# Patient Record
Sex: Female | Born: 1960 | Race: White | Hispanic: No | Marital: Married
Health system: Southern US, Community
[De-identification: ages and names within clinical notes are randomized; demographics above are authoritative.]

---

## 2005-01-31 ENCOUNTER — Ambulatory Visit: Payer: Self-pay | Admitting: Internal Medicine

## 2008-01-09 ENCOUNTER — Ambulatory Visit: Payer: Self-pay | Admitting: Obstetrics and Gynecology

## 2009-01-20 ENCOUNTER — Ambulatory Visit: Payer: Self-pay | Admitting: Obstetrics and Gynecology

## 2012-04-02 ENCOUNTER — Ambulatory Visit: Payer: Self-pay | Admitting: Obstetrics and Gynecology

## 2014-09-21 ENCOUNTER — Ambulatory Visit: Payer: Self-pay | Admitting: Obstetrics and Gynecology

## 2014-11-29 ENCOUNTER — Inpatient Hospital Stay: Payer: Self-pay | Admitting: Surgery

## 2014-11-29 LAB — COMPREHENSIVE METABOLIC PANEL
Albumin: 4 g/dL (ref 3.4–5.0)
Alkaline Phosphatase: 82 U/L
Anion Gap: 7 (ref 7–16)
BUN: 12 mg/dL (ref 7–18)
Bilirubin,Total: 0.4 mg/dL (ref 0.2–1.0)
CALCIUM: 10.3 mg/dL — AB (ref 8.5–10.1)
Chloride: 99 mmol/L (ref 98–107)
Co2: 30 mmol/L (ref 21–32)
Creatinine: 0.82 mg/dL (ref 0.60–1.30)
EGFR (Non-African Amer.): >60
GLUCOSE: 119 mg/dL — AB (ref 65–99)
Osmolality: 273 (ref 275–301)
Potassium: 3.7 mmol/L (ref 3.5–5.1)
SGOT(AST): 17 U/L (ref 15–37)
SGPT (ALT): 32 U/L
Sodium: 136 mmol/L (ref 136–145)
Total Protein: 7.7 g/dL (ref 6.4–8.2)

## 2014-11-29 LAB — URINALYSIS, COMPLETE
BACTERIA: NONE SEEN
BILIRUBIN, UR: NEGATIVE
BLOOD: NEGATIVE
GLUCOSE, UR: NEGATIVE mg/dL (ref 0–75)
KETONE: NEGATIVE
LEUKOCYTE ESTERASE: NEGATIVE
NITRITE: NEGATIVE
PH: 7 (ref 4.5–8.0)
PROTEIN: NEGATIVE
RBC, UR: NONE SEEN /HPF (ref 0–5)
Specific Gravity: 1.014 (ref 1.003–1.030)
WBC UR: NONE SEEN /HPF (ref 0–5)

## 2014-11-29 LAB — CBC WITH DIFFERENTIAL/PLATELET
Basophil #: 0.1 10*3/uL (ref 0.0–0.1)
Basophil %: 0.8 %
EOS PCT: 1.9 %
Eosinophil #: 0.2 10*3/uL (ref 0.0–0.7)
HCT: 39 % (ref 35.0–47.0)
HGB: 12.9 g/dL (ref 12.0–16.0)
LYMPHS ABS: 2.2 10*3/uL (ref 1.0–3.6)
Lymphocyte %: 18 %
MCH: 28.2 pg (ref 26.0–34.0)
MCHC: 33.2 g/dL (ref 32.0–36.0)
MCV: 85 fL (ref 80–100)
MONOS PCT: 3.3 %
Monocyte #: 0.4 x10 3/mm (ref 0.2–0.9)
Neutrophil #: 9.2 10*3/uL — ABNORMAL HIGH (ref 1.4–6.5)
Neutrophil %: 76 %
Platelet: 303 10*3/uL (ref 150–440)
RBC: 4.59 10*6/uL (ref 3.80–5.20)
RDW: 13.2 % (ref 11.5–14.5)
WBC: 12.1 10*3/uL — AB (ref 3.6–11.0)

## 2014-11-29 LAB — LIPASE, BLOOD: Lipase: 136 U/L (ref 73–393)

## 2014-11-30 LAB — CBC WITH DIFFERENTIAL/PLATELET
Basophil #: 0 10*3/uL (ref 0.0–0.1)
Basophil %: 0.2 %
EOS ABS: 0.1 10*3/uL (ref 0.0–0.7)
Eosinophil %: 0.5 %
HCT: 34.8 % — ABNORMAL LOW (ref 35.0–47.0)
HGB: 11.7 g/dL — AB (ref 12.0–16.0)
Lymphocyte #: 2.3 10*3/uL (ref 1.0–3.6)
Lymphocyte %: 19.4 %
MCH: 28.4 pg (ref 26.0–34.0)
MCHC: 33.6 g/dL (ref 32.0–36.0)
MCV: 84 fL (ref 80–100)
Monocyte #: 0.8 x10 3/mm (ref 0.2–0.9)
Monocyte %: 6.5 %
Neutrophil #: 8.8 10*3/uL — ABNORMAL HIGH (ref 1.4–6.5)
Neutrophil %: 73.4 %
PLATELETS: 263 10*3/uL (ref 150–440)
RBC: 4.13 10*6/uL (ref 3.80–5.20)
RDW: 13.5 % (ref 11.5–14.5)
WBC: 12 10*3/uL — ABNORMAL HIGH (ref 3.6–11.0)

## 2014-11-30 LAB — COMPREHENSIVE METABOLIC PANEL
ANION GAP: 8 (ref 7–16)
Albumin: 3.2 g/dL — ABNORMAL LOW (ref 3.4–5.0)
Alkaline Phosphatase: 76 U/L
BUN: 7 mg/dL (ref 7–18)
Bilirubin,Total: 0.5 mg/dL (ref 0.2–1.0)
CALCIUM: 8.5 mg/dL (ref 8.5–10.1)
CHLORIDE: 101 mmol/L (ref 98–107)
CREATININE: 0.74 mg/dL (ref 0.60–1.30)
Co2: 30 mmol/L (ref 21–32)
GLUCOSE: 104 mg/dL — AB (ref 65–99)
Osmolality: 276 (ref 275–301)
POTASSIUM: 4 mmol/L (ref 3.5–5.1)
SGOT(AST): 21 U/L (ref 15–37)
SGPT (ALT): 23 U/L
Sodium: 139 mmol/L (ref 136–145)
Total Protein: 6.9 g/dL (ref 6.4–8.2)

## 2015-03-12 ENCOUNTER — Ambulatory Visit
Admit: 2015-03-12 | Disposition: A | Discharge status: Home / Self Care | Payer: Self-pay | Attending: Gastroenterology | Admitting: Gastroenterology

## 2015-03-27 NOTE — H&P (Signed)
Subjective/Chief Complaint RUQ pain   History of Present Illness first episode nausea no emesis FFI diarrhea, no F/C   Past History PMH HTN PSH lap appy   Past Medical Health Hypertension   Past Med/Surgical Hx:  Appendicitis:   HTN:   Appendectomy:   ALLERGIES:  No Known Allergies:   Family and Social History:  Family History Non-Contributory   Social History negative tobacco, negative ETOH, [facility]   Place of Living Home   Review of Systems:  Fever/Chills No   Cough No   Abdominal Pain Yes   Diarrhea Yes   Constipation No   Nausea/Vomiting Yes   SOB/DOE No   Dysuria No   Tolerating Diet No  Nauseated   Medications/Allergies Reviewed Medications/Allergies reviewed   Physical Exam:  GEN no acute distress   HEENT pink conjunctivae   NECK supple   RESP normal resp effort  clear BS   CARD regular rate   ABD positive tenderness   LYMPH negative neck   EXTR negative edema   SKIN normal to palpation   PSYCH alert, A+O to time, place, person, good insight   Lab Results: Hepatic:  27-Dec-15 07:46   Bilirubin, Total 0.4  Alkaline Phosphatase 82 (46-116 NOTE: New Reference Range 06/23/14)  SGPT (ALT) 32 (14-63 NOTE: New Reference Range 06/23/14)  SGOT (AST) 17  Total Protein, Serum 7.7  Albumin, Serum 4.0  Routine Chem:  27-Dec-15 07:46   Glucose, Serum  119  BUN 12  Creatinine (comp) 0.82  Sodium, Serum 136  Potassium, Serum 3.7  Chloride, Serum 99  CO2, Serum 30  Calcium (Total), Serum  10.3  Osmolality (calc) 273  eGFR (African American) >60  eGFR (Non-African American) >60 (eGFR values <40m/min/1.73 m2 may be an indication of chronic kidney disease (CKD). Calculated eGFR, using the MRDR Study equation, is useful in  patients with stable renal function. The eGFR calculation will not be reliable in acutely ill patients when serum creatinine is changing rapidly. It is not useful in patients on dialysis. The eGFR  calculation may not be applicable to patients at the low and high extremes of body sizes, pregnant women, and vegetarians.)  Anion Gap 7  Lipase 136 (Result(s) reported on 29 Nov 2014 at 08:22AM.)  Routine UA:  27-Dec-15 07:46   Color (UA) Straw  Clarity (UA) Clear  Glucose (UA) Negative  Bilirubin (UA) Negative  Ketones (UA) Negative  Specific Gravity (UA) 1.014  Blood (UA) Negative  pH (UA) 7.0  Protein (UA) Negative  Nitrite (UA) Negative  Leukocyte Esterase (UA) Negative (Result(s) reported on 29 Nov 2014 at 08:25AM.)  RBC (UA) NONE SEEN  WBC (UA) NONE SEEN  Bacteria (UA) NONE SEEN  Epithelial Cells (UA) 1 /HPF  Mucous (UA) PRESENT (Result(s) reported on 29 Nov 2014 at 08:25AM.)  Routine Hem:  27-Dec-15 07:46   WBC (CBC)  12.1  RBC (CBC) 4.59  Hemoglobin (CBC) 12.9  Hematocrit (CBC) 39.0  Platelet Count (CBC) 303  MCV 85  MCH 28.2  MCHC 33.2  RDW 13.2  Neutrophil % 76.0  Lymphocyte % 18.0  Monocyte % 3.3  Eosinophil % 1.9  Basophil % 0.8  Neutrophil #  9.2  Lymphocyte # 2.2  Monocyte # 0.4  Eosinophil # 0.2  Basophil # 0.1 (Result(s) reported on 29 Nov 2014 at [facility])   Radiology Results: [place]    27-Dec-15 09:53, [place]  [place]  REASON FOR EXAM:    ruq pain radiating  to back  COMMENTS:   Body Site: Gallbladder, Liver, Common Bile Duct    PROCEDURE: [place]  - US ABDOMEN LIMITED SURVEY  - Nov 29 2014  9:53AM     CLINICAL DATA:  Right upper quadrant pain    EXAM:  US ABDOMEN LIMITED - RIGHT UPPER QUADRANT    COMPARISON:  None.    FINDINGS:  Gallbladder:  Multiple gallstones are present. The largest is 2.3 cm at the neck.  Gallbladder wall is 5 mm in thickness. There is a small amount of  pericholecystic fluid. Negative Murphy'ssign.    Common bile duct:    Diameter: 3 mm in caliber.    Liver:    No focal lesion identified.     IMPRESSION:  Cholelithiasis.  There is gallbladder wall thickening and  pericholecystic fluid.  Correlate clinically as for the need for nuclear medicine imaging to  evaluate for cystic duct patency.      Electronically Signed    By: Maryclare Bean M.D.    On: 11/29/2014 10:31         Verified By: Jamas Lav, M.D.,    Assessment/Admission Diagnosis ac chole admit hydrate IV abx pain and nausea control lap chole tomorrow   Electronic Signatures: Florene Glen (MD)  (Signed 27-Dec-15 13:44)  Authored: CHIEF COMPLAINT and HISTORY, PAST MEDICAL/SURGIAL HISTORY, ALLERGIES, FAMILY AND SOCIAL HISTORY, REVIEW OF SYSTEMS, PHYSICAL EXAM, LABS, Radiology, ASSESSMENT AND PLAN   Last Updated: 27-Dec-15 13:44 by Florene Glen (MD)

## 2015-03-27 NOTE — H&P (Signed)
PATIENT NAME:  [name], [name] MR#:  [id number] DATE OF BIRTH:  1961/03/25  DATE OF ADMISSION:  11/29/2014  CHIEF COMPLAINT: Right upper quadrant pain.   HISTORY OF PRESENT ILLNESS: This is a patient with a first and only episode of right upper quadrant pain followed by a fatty meal that started approximately midnight last night, has had nausea, but no emesis. She has had diarrhea, no melena or hematochezia. No fevers or chills. She also has some back pain. The right upper quadrant pain radiates through to her back and around the side.   PAST MEDICAL HISTORY: Hypertension.   [place]G[facility] [place] [facility]   ALLERGIES: None.   MEDICATIONS: Metoprolol.   FAMILY HISTORY: Mother had gallbladder disease and surgery.   SOCIAL HISTORY: The patient does not smoke or drink. She works at [employer].  REVIEW OF SYSTEMS:  A 10 system review was performed and negative with the exception that is mentioned in the history of present illness.   PHYSICAL EXAMINATION:  GENERAL: Healthy female patient in no acute distress.  VITAL SIGNS: Stable. She is afebrile.  HEENT: No scleral icterus.  NECK: No palpable neck nodes.  CHEST: Clear to auscultation.  CARDIAC: Regular rate and rhythm.  ABDOMEN: Soft, nondistended. Tenderness in the right upper quadrant with a positive Murphy's sign.  EXTREMITIES: Without edema.  NEUROLOGIC: Grossly intact.  INTEGUMENT: No jaundice.   IMAGING DATA:  Ultrasound shows stones.   LABORATORY DATA:  Liver function tests are within normal limits as is the hemogram with the exception of a mildly elevated white blood cell count of 12.1.   ASSESSMENT AND PLAN: This is a patient with probable acute cholecystitis.  I have recommended admission to the hospital. The rationale for this has been discussed. Control of nausea and pain is indicated and laparoscopic cholecystectomy will be performed tomorrow. The plan was discussed with her  and her husband. The options of observation were reviewed and the risks of bleeding, infection, conversion to an open procedure, bile duct damage, bile duct leak, retained common bile duct stone, any of which could require further surgery and/or ERCP, stent, and papillotomy were all discussed. She understood and agreed to proceed.     ____________________________ Adah Salvageichard E. Excell Seltzerooper, MD rec:at D: 11/29/2014 13:47:29 ET T: 11/29/2014 14:20:05 ET JOB#: [id number]  cc: Adah Salvageichard E. Excell Seltzerooper, MD, <Dictator> Lattie HawICHARD E Berdie Malter MD ELECTRONICALLY SIGNED 11/29/2014 17:27

## 2015-03-29 LAB — SURGICAL PATHOLOGY

## 2015-03-31 NOTE — Op Note (Signed)
PATIENT NAME:  [name], [name] DATE OF BIRTH:  31-Aug-1961  DATE OF PROCEDURE:  11/30/2014  ATTENDING SURGEON: Cristal Deerhristopher A. Daltyn Degroat, [name]   PREOPERATIVE DIAGNOSIS: Acute cholecystitis.   POSTOPERATIVE DIAGNOSIS: Acute cholecystitis.   PROCEDURE PERFORMED: Laparoscopic cholecystectomy.   ANESTHESIA: General.   ESTIMATED BLOOD LOSS: 10 mL   COMPLICATIONS: None.   SPECIMENS: Gallbladder.  INDICATION FOR SURGERY: [name] is a pleasant 54 year old who presents with acute-onset right upper quadrant pain, leukocytosis, and ultrasound findings consistent with cholecystitis. She was admitted for laparoscopic cholecystectomy.   DETAILS OF PROCEDURE: Informed consent was obtained. [name] was brought to the operating room suite. She was induced. Endotracheal tube was placed. General anesthesia was administered. Her abdomen was prepped and draped in standard surgical fashion. A timeout was then performed correctly identifying the patient name, operative site, and procedure to be performed. A supraumbilical incision was made.  It was deepened down to the fascia. The fascia was incised. Peritoneum was entered. There were 2 stay sutures placed in the fasciotomy. A Hasson trocar was placed in the abdomen and the abdomen was insufflated. Epigastric and 2 right 5 mm subcostal trocars were placed. The gallbladder was then lifted over the dome of the liver. The cystic artery and cystic duct were dissected out. There was significant inflammation. The critical view was obtained. The structures were clipped. The gallbladder was then taken off the gallbladder fossa and brought out with an Endo Catch bag through the umbilical incision. The gallbladder fossa was examined for hemostasis. Once hemostasis was obtained, all trocars were removed under direct visualization. The supraumbilical fascia was closed with figure-of-eight 0 Vicryl. The skin was then closed with interrupted 4-0 Monocryl deep  dermal sutures. Steri-Strips, Telfa gauze and Tegaderm were used to complete the dressing. The patient was then awoken, extubated, and brought to the postanesthesia care unit. There were no immediate complications. Needle, sponge, and instrument count was correct at the end of the procedure.    ____________________________ Ashley Raiderhristopher A. Lavona Norsworthy, [name] cal:ST D: 11/30/2014 17:57:14 ET T: 11/30/2014 21:36:27 ET JOB#: [id number]  cc: Cristal Deerhristopher A. Dejae Bernet, [name], <Dictator> Jarvis NewcomerHRISTOPHER A Deddrick Saindon [name] ELECTRONICALLY SIGNED 12/15/2014 19:46

## 2016-05-23 ENCOUNTER — Other Ambulatory Visit: Payer: Self-pay | Admitting: Internal Medicine

## 2016-05-23 DIAGNOSIS — Z1231 Encounter for screening mammogram for malignant neoplasm of breast: Secondary | ICD-10-CM

## 2016-06-13 ENCOUNTER — Ambulatory Visit
Admission: RE | Admit: 2016-06-13 | Discharge: 2016-06-13 | Disposition: A | Discharge status: Home / Self Care | Payer: Managed Care, Other (non HMO) | Source: Ambulatory Visit | Attending: Internal Medicine | Admitting: Internal Medicine

## 2016-06-13 ENCOUNTER — Other Ambulatory Visit: Payer: Self-pay | Admitting: Internal Medicine

## 2016-06-13 DIAGNOSIS — Z1231 Encounter for screening mammogram for malignant neoplasm of breast: Secondary | ICD-10-CM | POA: Diagnosis not present

## 2017-05-09 ENCOUNTER — Other Ambulatory Visit: Payer: Self-pay | Admitting: Internal Medicine

## 2017-05-09 DIAGNOSIS — Z1231 Encounter for screening mammogram for malignant neoplasm of breast: Secondary | ICD-10-CM

## 2017-06-14 ENCOUNTER — Ambulatory Visit
Admission: RE | Admit: 2017-06-14 | Discharge: 2017-06-14 | Disposition: A | Discharge status: Home / Self Care | Payer: Managed Care, Other (non HMO) | Source: Ambulatory Visit | Attending: Internal Medicine | Admitting: Internal Medicine

## 2017-06-14 ENCOUNTER — Ambulatory Visit: Payer: Managed Care, Other (non HMO)

## 2017-06-14 DIAGNOSIS — Z1231 Encounter for screening mammogram for malignant neoplasm of breast: Secondary | ICD-10-CM | POA: Diagnosis not present

## 2018-05-28 ENCOUNTER — Other Ambulatory Visit: Payer: Self-pay | Admitting: Internal Medicine

## 2019-12-11 ENCOUNTER — Other Ambulatory Visit: Payer: Self-pay | Admitting: Family

## 2019-12-11 DIAGNOSIS — Z1231 Encounter for screening mammogram for malignant neoplasm of breast: Secondary | ICD-10-CM

## 2020-01-06 ENCOUNTER — Ambulatory Visit
Admission: RE | Admit: 2020-01-06 | Discharge: 2020-01-06 | Disposition: A | Discharge status: Home / Self Care | Payer: Managed Care, Other (non HMO) | Source: Ambulatory Visit | Attending: Family | Admitting: Family

## 2020-01-06 DIAGNOSIS — Z1231 Encounter for screening mammogram for malignant neoplasm of breast: Secondary | ICD-10-CM | POA: Insufficient documentation

## 2022-01-20 IMAGING — MG DIGITAL SCREENING BILAT W/ TOMO W/ CAD
8 series · 8 of 24 positions shown · non-contrast
Comparison: Previous exam(s).

CLINICAL DATA: Screening.

EXAM:
DIGITAL SCREENING BILATERAL MAMMOGRAM WITH TOMO AND CAD

[L MLO synth-2D]
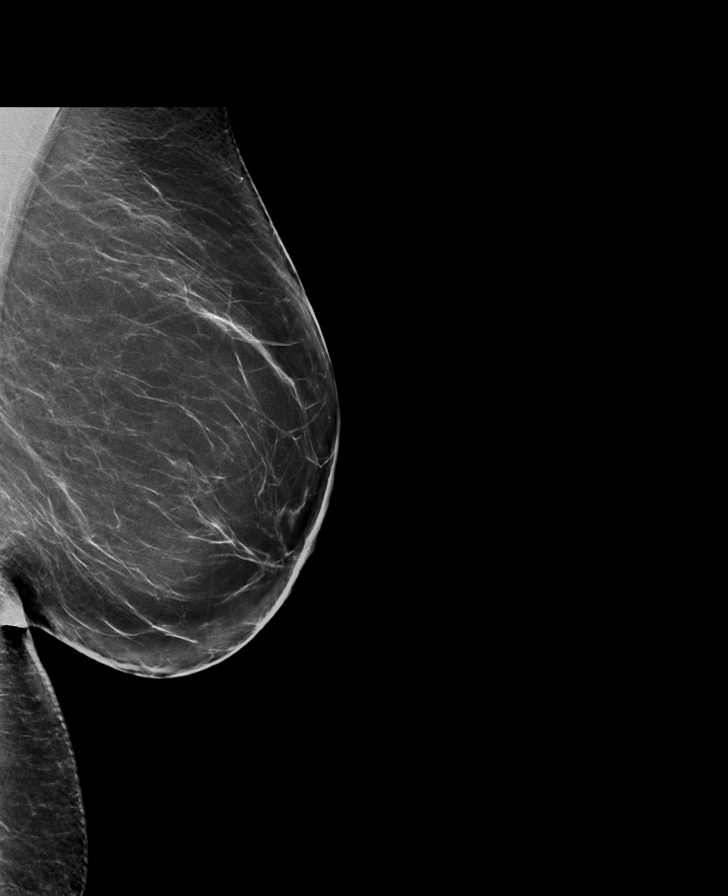

[R CC synth-2D]
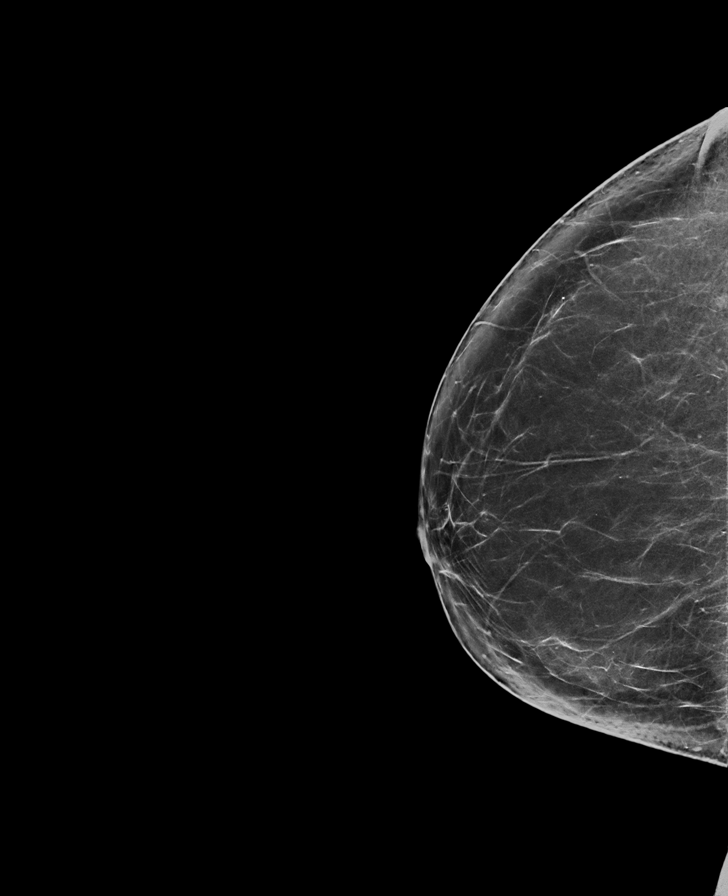

[R MLO synth-2D]
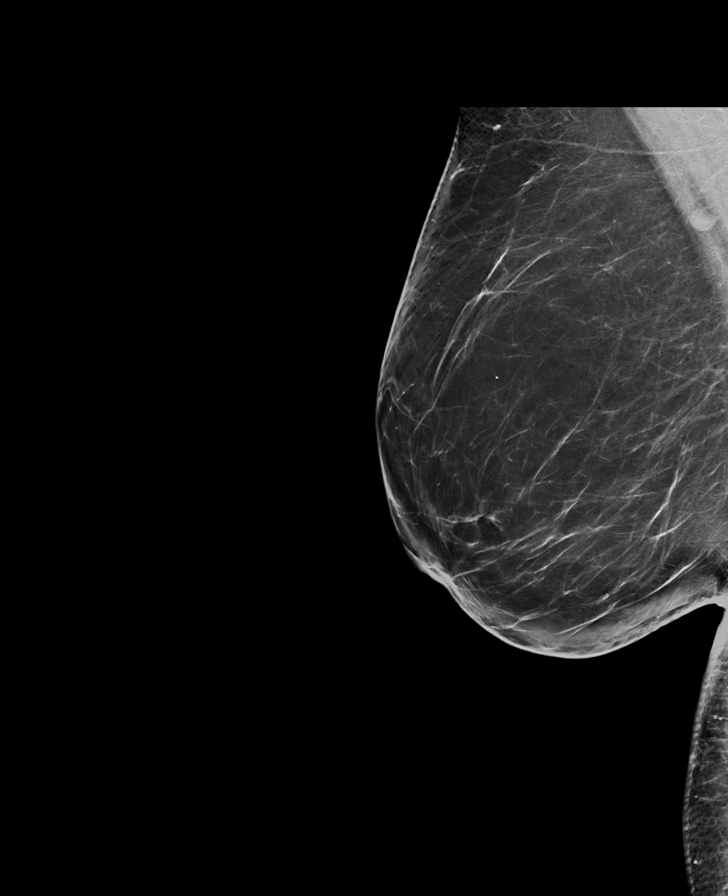

[L CC synth-2D]
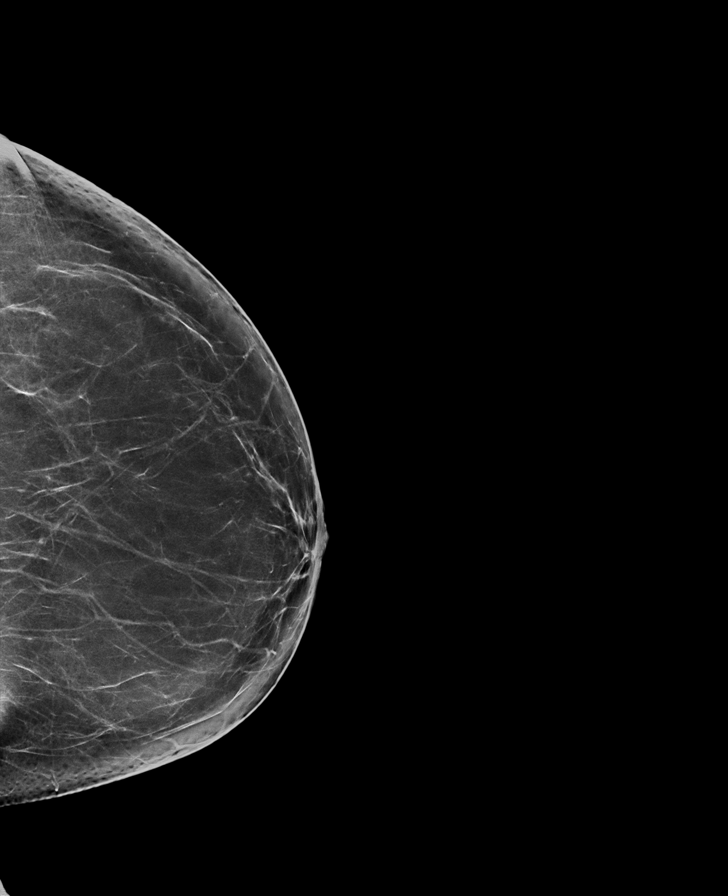

[L MLO tomo · tomo slice 47/93.0]
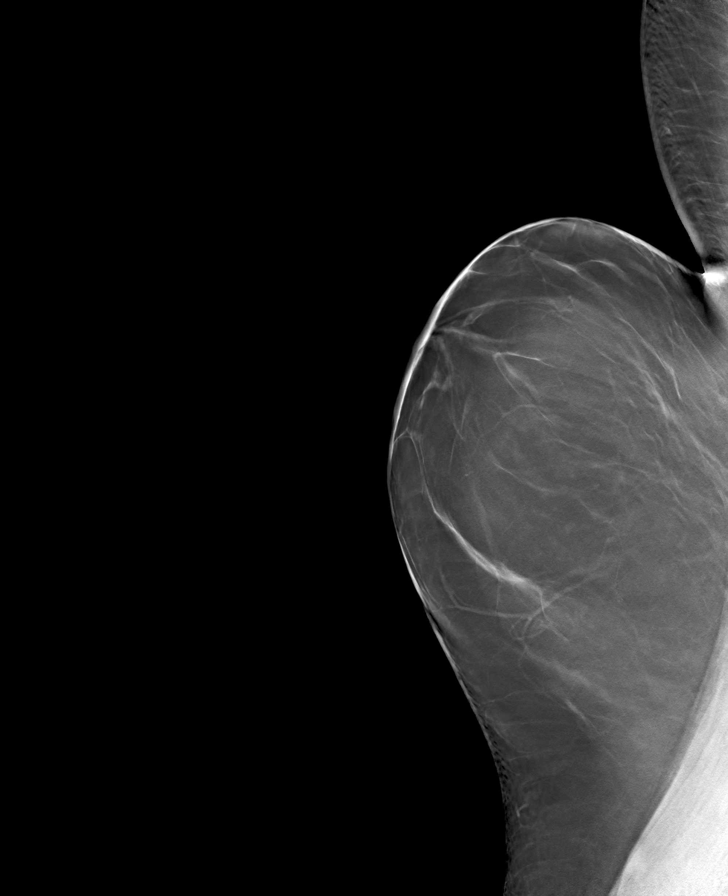

[R CC tomo · tomo slice 38/75.0]
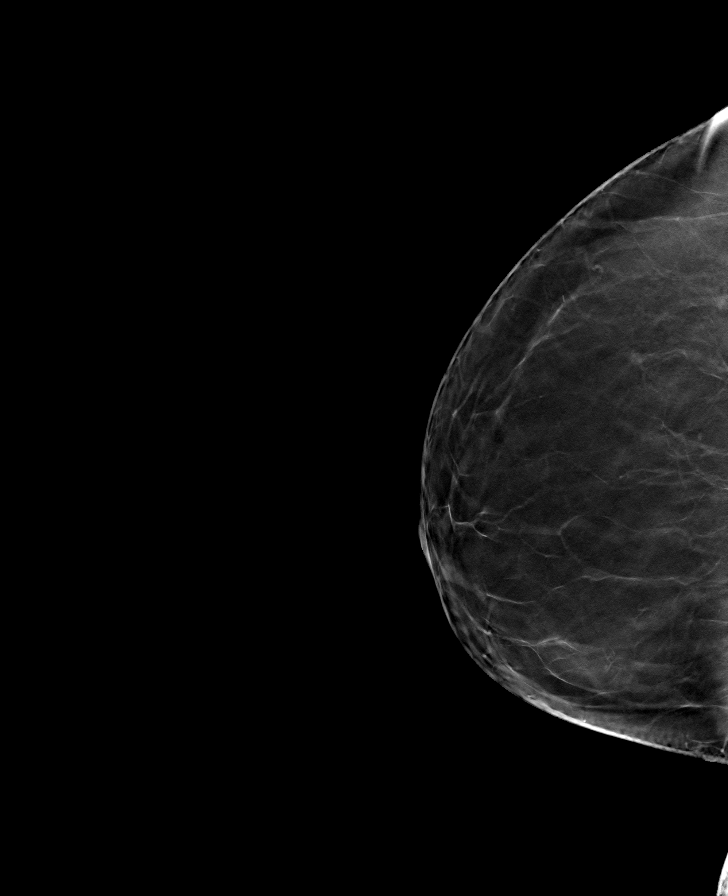

[R MLO tomo · tomo slice 43/84.0]
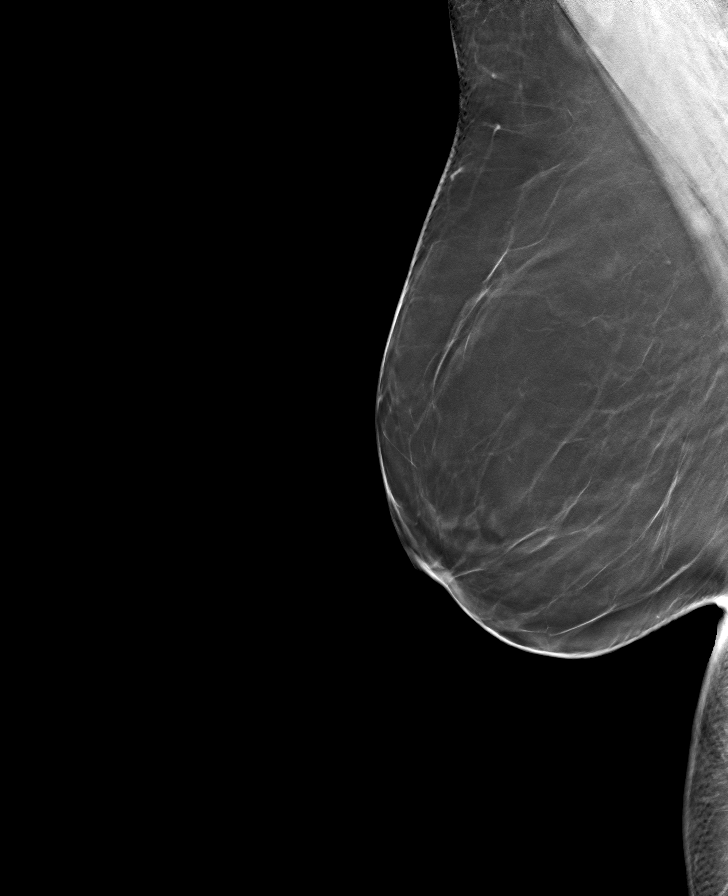

[L CC tomo · tomo slice 39/77.0]
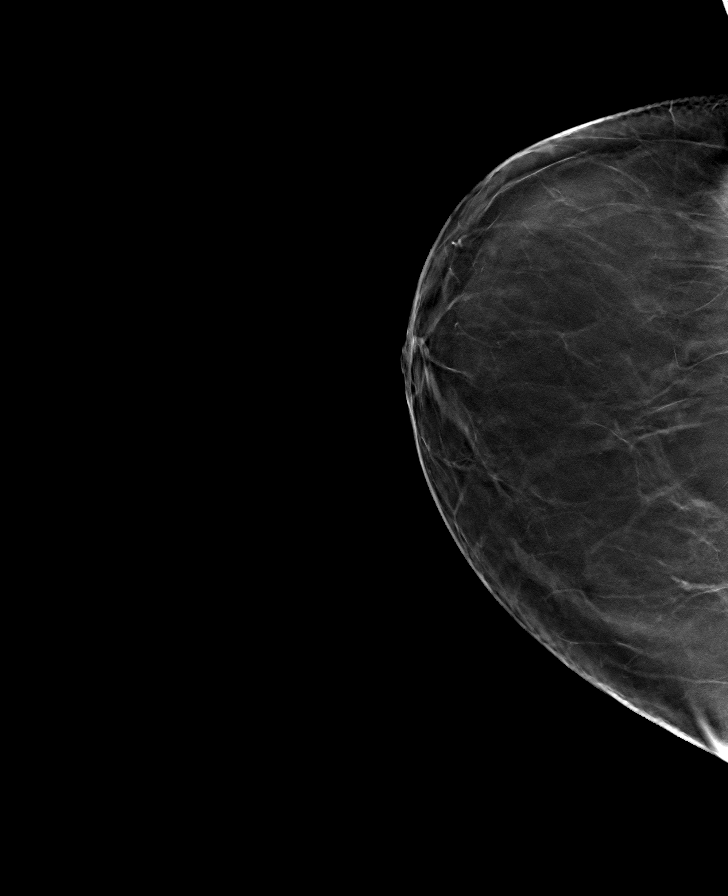

[8 of 24 positions shown; findings below may reference images not displayed]

ACR Breast Density Category b: There are scattered areas of
fibroglandular density.
FINDINGS: There are no findings suspicious for malignancy. Images were
processed with CAD.
IMPRESSION: No mammographic evidence of malignancy. A result letter of this
screening mammogram will be mailed directly to the patient.

RECOMMENDATION:
Screening mammogram in one year. (Code:[id number])

BI-RADS CATEGORY  1: Negative.

## 2022-06-29 ENCOUNTER — Other Ambulatory Visit: Payer: Self-pay | Admitting: Family

## 2022-06-29 DIAGNOSIS — Z1231 Encounter for screening mammogram for malignant neoplasm of breast: Secondary | ICD-10-CM

## 2022-07-20 ENCOUNTER — Ambulatory Visit
Admission: RE | Admit: 2022-07-20 | Discharge: 2022-07-20 | Disposition: A | Discharge status: Home / Self Care | Payer: Managed Care, Other (non HMO) | Source: Ambulatory Visit | Attending: Family | Admitting: Family

## 2022-07-20 DIAGNOSIS — Z1231 Encounter for screening mammogram for malignant neoplasm of breast: Secondary | ICD-10-CM | POA: Diagnosis present

## 2023-01-08 ENCOUNTER — Other Ambulatory Visit: Payer: Self-pay

## 2023-01-08 MED ORDER — ROSUVASTATIN CALCIUM 5 MG PO TABS: 5.0000 mg | ORAL_TABLET | Freq: Every day | ORAL | 3 refills | Status: DC

## 2023-01-08 MED ORDER — LISINOPRIL-HYDROCHLOROTHIAZIDE 10-12.5 MG PO TABS
1.0000 | ORAL_TABLET | Freq: Every day | ORAL | 3 refills | Status: DC
Start: 2023-01-08 — End: 2023-11-22

## 2023-01-08 MED ORDER — METOPROLOL SUCCINATE ER 25 MG PO TB24: 25.0000 mg | ORAL_TABLET | Freq: Every day | ORAL | 3 refills | Status: DC

## 2023-03-22 ENCOUNTER — Ambulatory Visit: Payer: Managed Care, Other (non HMO) | Admitting: Dermatology

## 2023-03-22 VITALS — BP 124/77 | HR 71

## 2023-03-22 DIAGNOSIS — L409 Psoriasis, unspecified: Secondary | ICD-10-CM

## 2023-03-22 DIAGNOSIS — L821 Other seborrheic keratosis: Secondary | ICD-10-CM

## 2023-03-22 DIAGNOSIS — L814 Other melanin hyperpigmentation: Secondary | ICD-10-CM | POA: Diagnosis not present

## 2023-03-22 DIAGNOSIS — L578 Other skin changes due to chronic exposure to nonionizing radiation: Secondary | ICD-10-CM | POA: Diagnosis not present

## 2023-03-22 DIAGNOSIS — Z1283 Encounter for screening for malignant neoplasm of skin: Secondary | ICD-10-CM

## 2023-03-22 DIAGNOSIS — D2372 Other benign neoplasm of skin of left lower limb, including hip: Secondary | ICD-10-CM

## 2023-03-22 DIAGNOSIS — I8310 Varicose veins of unspecified lower extremity with inflammation: Secondary | ICD-10-CM

## 2023-03-22 DIAGNOSIS — D2239 Melanocytic nevi of other parts of face: Secondary | ICD-10-CM

## 2023-03-22 MED ORDER — VTAMA 1 % EX CREA: TOPICAL_CREAM | CUTANEOUS | 3 refills | Status: DC

## 2023-03-22 NOTE — Progress Notes (Signed)
New Patient Visit   Subjective  [name] is a 62 y.o. female who presents for the following: Skin Cancer Screening and Full Body Skin Exam  The patient presents for Total-Body Skin Exam (TBSE) for skin cancer screening and mole check. The patient has spots, moles and lesions to be evaluated, some may be new or changing and the patient has concerns that these could be cancer.    The following portions of the chart were reviewed this encounter and updated as appropriate: medications, allergies, medical history  Review of Systems:  No other skin or systemic complaints except as noted in HPI or Assessment and Plan.  Objective  Well appearing patient in no apparent distress; mood and affect are within normal limits.  A full examination was performed including scalp, head, eyes, ears, nose, lips, neck, chest, axillae, abdomen, back, buttocks, bilateral upper extremities, bilateral lower extremities, hands, feet, fingers, toes, fingernails, and toenails. All findings within normal limits unless otherwise noted below.   Relevant physical exam findings are noted in the Assessment and Plan.    Assessment & Plan   LENTIGINES, SEBORRHEIC KERATOSES, HEMANGIOMAS - Benign normal skin lesions - Benign-appearing - Call for any changes  MELANOCYTIC NEVI - Tan-brown and/or pink-flesh-colored symmetric macules and papules - Benign appearing on exam today - Observation - Call clinic for new or changing moles - Recommend daily use of broad spectrum spf 30+ sunscreen to sun-exposed areas.   ACTINIC DAMAGE - Chronic condition, secondary to cumulative UV/sun exposure - diffuse scaly erythematous macules with underlying dyspigmentation - Recommend daily broad spectrum sunscreen SPF 30+ to sun-exposed areas, reapply every 2 hours as needed.  - Staying in the shade or wearing long sleeves, sun glasses (UVA+UVB protection) and wide brim hats (4-inch brim around the entire circumference of the  hat) are also recommended for sun protection.  - Call for new or changing lesions.  DERMATOFIBROMA - L lower leg  Exam: Firm pink/brown papulenodule with dimple sign. Treatment Plan: A dermatofibroma is a benign growth possibly related to trauma, such as an insect bite, cut from shaving, or inflamed acne-type bump.  Treatment options to remove include shave or excision with resulting scar and risk of recurrence.  Since benign-appearing and not bothersome, will observe for now.   PSORIASIS with scalp and hand involvement  Exam: Well-demarcated erythematous papules/plaques with silvery scale, guttate pink scaly papules. 1% BSA.  Chronic and persistent condition with duration or expected duration over one year. Condition is symptomatic/ bothersome to patient. Not currently at goal.  Pt c/o joint pain in the hands and stiffness in the morning which resolves after a few minutes. She has been diagnosed with arthritis.    Psoriasis is a chronic non-curable, but treatable genetic/hereditary disease that may have other systemic features affecting other organ systems such as joints (Psoriatic Arthritis). It is associated with an increased risk of inflammatory bowel disease, heart disease, non-alcoholic fatty liver disease, and depression.  Treatments include light and laser treatments; topical medications; and systemic medications including oral and injectables.  Treatment Plan:  Patient defers biologic injections. Start Vtama cream to aa's QD.   Varicose Veins/Spider Veins - Dilated blue, purple or red veins at the lower extremities - Reassured - Smaller vessels can be treated by sclerotherapy (a procedure to inject a medicine into the veins to make them disappear) if desired, but the treatment is not covered by insurance. Larger vessels may be covered if symptomatic and we would refer to vascular surgeon if  treatment desired.  MELANOCYTIC NEVI, irritated - L preauricular x 2 Exam: Tan-brown and/or  pink-flesh-colored symmetric macules and papules  Treatment Plan: Benign appearing on exam today. Recommend observation. Call clinic for new or changing moles. Recommend daily use of broad spectrum spf 30+ sunscreen to sun-exposed areas. Patient would like to schedule for removal.   SKIN CANCER SCREENING PERFORMED TODAY.  Return in about 1 year (around 03/21/2024) for TBSE, schedule for shave removal of irritated moles on the face.  Maylene Roes, CMA, am acting as scribe for Darden Dates, MD .  Documentation: I have reviewed the above documentation for accuracy and completeness, and I agree with the above.  Darden Dates, MD

## 2023-03-22 NOTE — Patient Instructions (Addendum)

## 2023-03-23 ENCOUNTER — Encounter: Payer: Self-pay | Admitting: Dermatology

## 2023-03-26 ENCOUNTER — Other Ambulatory Visit: Payer: Self-pay

## 2023-03-26 MED ORDER — VTAMA 1 % EX CREA: TOPICAL_CREAM | CUTANEOUS | 3 refills | Status: AC

## 2023-04-25 ENCOUNTER — Ambulatory Visit: Payer: Managed Care, Other (non HMO) | Admitting: Dermatology

## 2023-04-25 VITALS — BP 127/75 | HR 82

## 2023-04-25 DIAGNOSIS — D2239 Melanocytic nevi of other parts of face: Secondary | ICD-10-CM

## 2023-04-25 DIAGNOSIS — L739 Follicular disorder, unspecified: Secondary | ICD-10-CM | POA: Diagnosis not present

## 2023-04-25 DIAGNOSIS — D485 Neoplasm of uncertain behavior of skin: Secondary | ICD-10-CM

## 2023-04-25 NOTE — Patient Instructions (Addendum)
Wound Care Instructions  Cleanse wound gently with soap and water once a day then pat dry with clean gauze. Apply a thin coat of Petrolatum (petroleum jelly, "Vaseline") over the wound (unless you have an allergy to this). We recommend that you use a new, sterile tube of Vaseline. Do not pick or remove scabs. Do not remove the yellow or white "healing tissue" from the base of the wound.  Cover the wound with fresh, clean, nonstick gauze and secure with paper tape. You may use Band-Aids in place of gauze and tape if the wound is small enough, but would recommend trimming much of the tape off as there is often too much. Sometimes Band-Aids can irritate the skin.  You should call the office for your biopsy report after 1 week if you have not already been contacted.  If you experience any problems, such as abnormal amounts of bleeding, swelling, significant bruising, significant pain, or evidence of infection, please call the office immediately.  FOR ADULT SURGERY PATIENTS: If you need something for pain relief you may take 1 extra strength Tylenol (acetaminophen) AND 2 Ibuprofen (200mg each) together every 4 hours as needed for pain. (do not take these if you are allergic to them or if you have a reason you should not take them.) Typically, you may only need pain medication for 1 to 3 days.     Due to recent changes in healthcare laws, you may see results of your pathology and/or laboratory studies on MyChart before the doctors have had a chance to review them. We understand that in some cases there may be results that are confusing or concerning to you. Please understand that not all results are received at the same time and often the doctors may need to interpret multiple results in order to provide you with the best plan of care or course of treatment. Therefore, we ask that you please give us 2 business days to thoroughly review all your results before contacting the office for clarification. Should  we see a critical lab result, you will be contacted sooner.   If You Need Anything After Your Visit  If you have any questions or concerns for your doctor, please call our main line at [phone] and press option 4 to reach your doctor's medical assistant. If no one answers, please leave a voicemail as directed and we will return your call as soon as possible. Messages left after 4 pm will be answered the following business day.   You may also send us a message via MyChart. We typically respond to MyChart messages within 1-2 business days.  For prescription refills, please ask your pharmacy to contact our office. Our fax number is [phone].  If you have an urgent issue when the clinic is closed that cannot wait until the next business day, you can page your doctor at the number below.    Please note that while we do our best to be available for urgent issues outside of office hours, we are not available 24/7.   If you have an urgent issue and are unable to reach us, you may choose to seek medical care at your doctor's office, retail clinic, urgent care center, or emergency room.  If you have a medical emergency, please immediately call 911 or go to the emergency department.  Pager Numbers  - Dr. Kowalski: [phone]  - Dr. Moye: [phone]  - Dr. Stewart: [phone]  In the event of inclement weather, please call our main line at   [phone] for an update on the status of any delays or closures.  Dermatology Medication Tips: Please keep the boxes that topical medications come in in order to help keep track of the instructions about where and how to use these. Pharmacies typically print the medication instructions only on the boxes and not directly on the medication tubes.   If your medication is too expensive, please contact our office at [phone] option 4 or send us a message through MyChart.   We are unable to tell what your co-pay for medications will be in  advance as this is different depending on your insurance coverage. However, we may be able to find a substitute medication at lower cost or fill out paperwork to get insurance to cover a needed medication.   If a prior authorization is required to get your medication covered by your insurance company, please allow us 1-2 business days to complete this process.  Drug prices often vary depending on where the prescription is filled and some pharmacies may offer cheaper prices.  The website www.goodrx.com contains coupons for medications through different pharmacies. The prices here do not account for what the cost may be with help from insurance (it may be cheaper with your insurance), but the website can give you the price if you did not use any insurance.  - You can print the associated coupon and take it with your prescription to the pharmacy.  - You may also stop by our office during regular business hours and pick up a GoodRx coupon card.  - If you need your prescription sent electronically to a different pharmacy, notify our office through [facility] MyChart or by phone at [phone] option 4.     Si Usted Necesita Algo Despus de Su Visita  Tambin puede enviarnos un mensaje a travs de MyChart. Por lo general respondemos a los mensajes de MyChart en el transcurso de 1 a 2 das hbiles.  Para renovar recetas, por favor pida a su farmacia que se ponga en contacto con nuestra oficina. Nuestro nmero de fax es el [phone].  Si tiene un asunto urgente cuando la clnica est cerrada y que no puede esperar hasta el siguiente da hbil, puede llamar/localizar a su doctor(a) al nmero que aparece a continuacin.   Por favor, tenga en cuenta que aunque hacemos todo lo posible para estar disponibles para asuntos urgentes fuera del horario de oficina, no estamos disponibles las 24 horas del da, los 7 das de la semana.   Si tiene un problema urgente y no puede comunicarse con nosotros, puede  optar por buscar atencin mdica  en el consultorio de su doctor(a), en una clnica privada, en un centro de atencin urgente o en una sala de emergencias.  Si tiene una emergencia mdica, por favor llame inmediatamente al 911 o vaya a la sala de emergencias.  Nmeros de bper  - Dr. Kowalski: [phone]  - Dra. Moye: [phone]  - Dra. Stewart: [phone]  En caso de inclemencias del tiempo, por favor llame a nuestra lnea principal al [phone] para una actualizacin sobre el estado de cualquier retraso o cierre.  Consejos para la medicacin en dermatologa: Por favor, guarde las cajas en las que vienen los medicamentos de uso tpico para ayudarle a seguir las instrucciones sobre dnde y cmo usarlos. Las farmacias generalmente imprimen las instrucciones del medicamento slo en las cajas y no directamente en los tubos del medicamento.   Si su medicamento es muy caro, por favor, pngase en contacto con   nuestra oficina llamando al [phone] y presione la opcin 4 o envenos un mensaje a travs de MyChart.   No podemos decirle cul ser su copago por los medicamentos por adelantado ya que esto es diferente dependiendo de la cobertura de su seguro. Sin embargo, es posible que podamos encontrar un medicamento sustituto a menor costo o llenar un formulario para que el seguro cubra el medicamento que se considera necesario.   Si se requiere una autorizacin previa para que su compaa de seguros cubra su medicamento, por favor permtanos de 1 a 2 das hbiles para completar este proceso.  Los precios de los medicamentos varan con frecuencia dependiendo del lugar de dnde se surte la receta y alguna farmacias pueden ofrecer precios ms baratos.  El sitio web www.goodrx.com tiene cupones para medicamentos de diferentes farmacias. Los precios aqu no tienen en cuenta lo que podra costar con la ayuda del seguro (puede ser ms barato con su seguro), pero el sitio web puede darle el  precio si no utiliz ningn seguro.  - Puede imprimir el cupn correspondiente y llevarlo con su receta a la farmacia.  - Tambin puede pasar por nuestra oficina durante el horario de atencin regular y recoger una tarjeta de cupones de GoodRx.  - Si necesita que su receta se enve electrnicamente a una farmacia diferente, informe a nuestra oficina a travs de MyChart de [place] o por telfono llamando al [phone] y presione la opcin 4.  

## 2023-04-25 NOTE — Progress Notes (Signed)
   Follow-Up Visit   Subjective  [name] is a 62 y.o. female who presents for the following: Irritated skin lesions, L preauricular x 2, patient here today for shave removal.   The following portions of the chart were reviewed this encounter and updated as appropriate: medications, allergies, medical history  Review of Systems:  No other skin or systemic complaints except as noted in HPI or Assessment and Plan.  Objective  Well appearing patient in no apparent distress; mood and affect are within normal limits.  A focused examination was performed of the following areas: the face   Relevant exam findings are noted in the Assessment and Plan.  L preauricular anterior Flesh colored papule 0.5 cm   L preauricular posterior Brown papule 1.0 cm       Assessment & Plan     Neoplasm of uncertain behavior of skin (2)  Prior to removal, reviewed risk of recurrence, dark spot appearing within the scar, and scar.   L preauricular anterior  Epidermal / dermal shaving  Lesion diameter (cm):  0.5 Informed consent: discussed and consent obtained   Timeout: patient name, date of birth, surgical site, and procedure verified   Procedure prep:  Patient was prepped and draped in usual sterile fashion Prep type:  Isopropyl alcohol Anesthesia: the lesion was anesthetized in a standard fashion   Anesthetic:  1% lidocaine w/ epinephrine 1-100,000 buffered w/ 8.4% NaHCO3 Instrument used: flexible razor blade   Hemostasis achieved with: pressure, aluminum chloride and electrodesiccation   Outcome: patient tolerated procedure well   Post-procedure details: sterile dressing applied and wound care instructions given   Dressing type: bandage and petrolatum    Anatomic Pathology Report  L preauricular posterior  Epidermal / dermal shaving  Lesion diameter (cm):  1 Informed consent: discussed and consent obtained   Timeout: patient name, date of birth, surgical site, and  procedure verified   Procedure prep:  Patient was prepped and draped in usual sterile fashion Prep type:  Isopropyl alcohol Anesthesia: the lesion was anesthetized in a standard fashion   Anesthetic:  1% lidocaine w/ epinephrine 1-100,000 buffered w/ 8.4% NaHCO3 Instrument used: flexible razor blade   Hemostasis achieved with: pressure, aluminum chloride and electrodesiccation   Outcome: patient tolerated procedure well   Post-procedure details: sterile dressing applied and wound care instructions given   Dressing type: bandage and petrolatum     Return for appointment as scheduled.  Maylene Roes, CMA, am acting as scribe for Darden Dates, MD .  Documentation: I have reviewed the above documentation for accuracy and completeness, and I agree with the above.  Darden Dates, MD

## 2023-05-02 ENCOUNTER — Telehealth: Payer: Self-pay

## 2023-05-02 LAB — ANATOMIC PATHOLOGY REPORT

## 2023-05-02 NOTE — Telephone Encounter (Signed)
-----   Message from Sandi Mealy, MD sent at 05/02/2023 12:28 PM EDT ----- 1-Skin Biopsy, Left Preauricular Anterior: -  COMPOUND MELANOCYTIC NEVUS. - FOLLICULITIS, RUPTURED.  --> This is a NORMAL MOLE with inflammation around a hair follicle. No additional treatment is needed. If you notice any new or changing spots or have other skin concerns in future, please call our office at [phone].     Specimen 2-Skin Biopsy, Left Preauricular Posterior:  COMPOUND MELANOCYTIC NEVUS.   This is a NORMAL MOLE. No additional treatment is needed. If you notice any new or changing spots or have other skin concerns in future, please call our office at [phone].       MAs please call. Thank you!

## 2023-05-02 NOTE — Telephone Encounter (Signed)
Patient advised pathology showed benign normal moles. Fabrizzio Marcella S., RMA 

## 2023-05-04 ENCOUNTER — Other Ambulatory Visit: Payer: Self-pay | Admitting: Family

## 2023-08-22 ENCOUNTER — Ambulatory Visit: Payer: Managed Care, Other (non HMO) | Admitting: Dermatology

## 2023-08-22 ENCOUNTER — Encounter: Payer: Self-pay | Admitting: Dermatology

## 2023-08-22 DIAGNOSIS — L409 Psoriasis, unspecified: Secondary | ICD-10-CM | POA: Diagnosis not present

## 2023-08-22 DIAGNOSIS — L820 Inflamed seborrheic keratosis: Secondary | ICD-10-CM

## 2023-08-22 MED ORDER — CLOBETASOL PROPIONATE 0.05 % EX CREA
TOPICAL_CREAM | CUTANEOUS | 2 refills | Status: AC
Start: 2023-08-22 — End: ?

## 2023-08-22 NOTE — Progress Notes (Signed)
   Follow-Up Visit   Subjective  [name] is a 62 y.o. female who presents for the following: spot at right arm, has gotten larger and rough. No itch. Present for a few months.  Patient with a hx of psoriasis at elbows and hands, using Vtama but not much improvement.  The patient has spots, moles and lesions to be evaluated, some may be new or changing and the patient may have concern these could be cancer.   The following portions of the chart were reviewed this encounter and updated as appropriate: medications, allergies, medical history  Review of Systems:  No other skin or systemic complaints except as noted in HPI or Assessment and Plan.  Objective  Well appearing patient in no apparent distress; mood and affect are within normal limits.   A focused examination was performed of the following areas: Right arm  Relevant exam findings are noted in the Assessment and Plan.  Right Forearm Erythematous stuck-on, waxy papule or plaque       Assessment & Plan     Inflamed seborrheic keratosis Right Forearm  Symptomatic, irritating, patient would like treated.  Benign-appearing.  Call clinic for new or changing lesions.    Destruction of lesion - Right Forearm  Destruction method: cryotherapy   Informed consent: discussed and consent obtained   Lesion destroyed using liquid nitrogen: Yes   Cryotherapy cycles:  2 Outcome: patient tolerated procedure well with no complications   Post-procedure details: wound care instructions given     PSORIASIS Exam: R extensor elbow Well-demarcated erythematous plaque with postinflammatory hypopigmentation. 1% BSA.  Chronic and persistent condition with duration or expected duration over one year. Condition is symptomatic/ bothersome to patient. Improved on treatment but still flaring. Not currently at goal.  Patient does have osteoarthritis that is worse in the cold weather and has seen [facility] -    Psoriasis is a  chronic non-curable, but treatable genetic/hereditary disease that may have other systemic features affecting other organ systems such as joints (Psoriatic Arthritis). It is associated with an increased risk of inflammatory bowel disease, heart disease, non-alcoholic fatty liver disease, and depression.  Treatments include light and laser treatments; topical medications; and systemic medications including oral and injectables.  Treatment Plan: Start clobetasol 0.05% cream twice daily until smooth then as needed. Avoid applying to face, groin, and axilla. Use as directed. Long-term use can cause thinning of the skin.  May use Vtama once daily for maintenance once smooth.   Topical steroids (such as triamcinolone, fluocinolone, fluocinonide, mometasone, clobetasol, halobetasol, betamethasone, hydrocortisone) can cause thinning and lightening of the skin if they are used for too long in the same area. Your physician has selected the right strength medicine for your problem and area affected on the body. Please use your medication only as directed by your physician to prevent side effects.     Return for as scheduled.  Anise Salvo, RMA, am acting as scribe for Elie Goody, MD .   Documentation: I have reviewed the above documentation for accuracy and completeness, and I agree with the above.  Elie Goody, MD

## 2023-08-22 NOTE — Patient Instructions (Addendum)
Cryotherapy Aftercare  Wash gently with soap and water everyday.   Apply Vaseline and Band-Aid daily until healed.   Treatment Plan: Start clobetasol 0.05% cream twice daily until smooth then as needed. Avoid applying to face, groin, and axilla. Use as directed. Long-term use can cause thinning of the skin.  May use Vtama for maintenance once smooth.   Topical steroids (such as triamcinolone, fluocinolone, fluocinonide, mometasone, clobetasol, halobetasol, betamethasone, hydrocortisone) can cause thinning and lightening of the skin if they are used for too long in the same area. Your physician has selected the right strength medicine for your problem and area affected on the body. Please use your medication only as directed by your physician to prevent side effects.    Due to recent changes in healthcare laws, you may see results of your pathology and/or laboratory studies on MyChart before the doctors have had a chance to review them. We understand that in some cases there may be results that are confusing or concerning to you. Please understand that not all results are received at the same time and often the doctors may need to interpret multiple results in order to provide you with the best plan of care or course of treatment. Therefore, we ask that you please give [place] 2 business days to thoroughly review all your results before contacting the office for clarification. Should we see a critical lab result, you will be contacted sooner.   If You Need Anything After Your Visit  If you have any questions or concerns for your doctor, please call our main line at [phone] and press option 4 to reach your doctor's medical assistant. If no one answers, please leave a voicemail as directed and we will return your call as soon as possible. Messages left after 4 pm will be answered the following business day.   You may also send [place] a message via MyChart. We typically respond to MyChart messages within  1-2 business days.  For prescription refills, please ask your pharmacy to contact our office. Our fax number is [phone].  If you have an urgent issue when the clinic is closed that cannot wait until the next business day, you can page your doctor at the number below.    Please note that while we do our best to be available for urgent issues outside of office hours, we are not available 24/7.   If you have an urgent issue and are unable to reach [place], you may choose to seek medical care at your doctor's office, retail clinic, urgent care center, or emergency room.  If you have a medical emergency, please immediately call 911 or go to the emergency department.  Pager Numbers  - Dr. Gwen Pounds: [phone]  - Dr. Roseanne Reno: [phone]  - Dr. Katrinka Blazing: [phone]   In the event of inclement weather, please call our main line at [phone] for an update on the status of any delays or closures.  Dermatology Medication Tips: Please keep the boxes that topical medications come in in order to help keep track of the instructions about where and how to use these. Pharmacies typically print the medication instructions only on the boxes and not directly on the medication tubes.   If your medication is too expensive, please contact our office at [phone] option 4 or send [place] a message through MyChart.   We are unable to tell what your co-pay for medications will be in advance as this is different depending on your insurance coverage. However, we may be  able to find a substitute medication at lower cost or fill out paperwork to get insurance to cover a needed medication.   If a prior authorization is required to get your medication covered by your insurance company, please allow [place] 1-2 business days to complete this process.  Drug prices often vary depending on where the prescription is filled and some pharmacies may offer cheaper prices.  The website www.goodrx.com contains coupons for  medications through different pharmacies. The prices here do not account for what the cost may be with help from insurance (it may be cheaper with your insurance), but the website can give you the price if you did not use any insurance.  - You can print the associated coupon and take it with your prescription to the pharmacy.  - You may also stop by our office during regular business hours and pick up a GoodRx coupon card.  - If you need your prescription sent electronically to a different pharmacy, notify our office through [facility] or by phone at [phone] option 4.     Si Usted Necesita Algo Despus de Su Visita  Tambin puede enviarnos un mensaje a travs de Clinical cytogeneticist. Por lo general respondemos a los mensajes de MyChart en el transcurso de 1 a 2 das hbiles.  Para renovar recetas, por favor pida a su farmacia que se ponga en contacto con nuestra oficina. Annie Sable de fax es [place] [phone].  Si tiene un asunto urgente cuando la clnica est cerrada y que no puede esperar hasta el siguiente da hbil, puede llamar/localizar a su doctor(a) al nmero que aparece a continuacin.   Por favor, tenga en cuenta que aunque hacemos todo lo posible para estar disponibles para asuntos urgentes fuera del horario de [place], no estamos disponibles las 24 horas del da, los 7 [address]  [address] de la [place].   Si tiene un problema urgente y no puede comunicarse con nosotros, puede optar por buscar atencin mdica  en el consultorio de su doctor(a), en una clnica privada, en un centro de atencin urgente o en una sala de emergencias.  Si tiene Engineer, drilling, por favor llame inmediatamente al 911 o vaya a la sala de emergencias.  Nmeros de bper  - Dr. Gwen Pounds: [phone]  - Dra. Roseanne Reno: [phone]  - Dr. Katrinka Blazing: [phone]   En caso de inclemencias del tiempo, por favor llame a Lacy Duverney principal al [phone] para una actualizacin sobre el [place] de cualquier retraso  o cierre.  Consejos para la medicacin en dermatologa: Por favor, guarde las cajas en las que vienen los medicamentos de uso tpico para ayudarle a seguir las instrucciones sobre dnde y cmo usarlos. Las farmacias generalmente imprimen las instrucciones del medicamento slo en las cajas y no directamente en los tubos del [place].   Si su medicamento es muy caro, por favor, pngase en contacto con Rolm Gala llamando al [phone] y presione la opcin 4 o envenos un mensaje a travs de Clinical cytogeneticist.   No podemos decirle cul ser su copago por los medicamentos por adelantado ya que esto es diferente dependiendo de la cobertura de su seguro. Sin embargo, es posible que podamos encontrar un medicamento sustituto a Audiological scientist un formulario para que el seguro cubra el medicamento que se considera necesario.   Si se requiere una autorizacin previa para que su compaa de seguros [place] su medicamento, por favor permtanos de 1 a 2 das hbiles para completar [address]  Los precios de los medicamentos varan  con frecuencia dependiendo del lugar de dnde se surte la receta y alguna farmacias pueden ofrecer precios ms baratos.  El sitio web www.goodrx.com tiene cupones para medicamentos de Health and safety inspector. Los precios aqu no tienen en cuenta lo que podra costar con la ayuda del seguro (puede ser ms barato con su seguro), pero el sitio web puede darle el precio si no utiliz Tourist information centre manager.  - Puede imprimir el cupn correspondiente y llevarlo con su receta a la farmacia.  - Tambin puede pasar por nuestra oficina durante el horario de atencin regular y Education officer, museum una tarjeta de cupones de GoodRx.  - Si necesita que su receta se enve electrnicamente a una farmacia diferente, informe a nuestra oficina a travs de MyChart de  o por telfono llamando al [phone] y presione la opcin 4.

## 2023-09-24 ENCOUNTER — Other Ambulatory Visit: Payer: Self-pay | Admitting: Family

## 2023-09-24 DIAGNOSIS — Z1231 Encounter for screening mammogram for malignant neoplasm of breast: Secondary | ICD-10-CM

## 2023-10-03 ENCOUNTER — Ambulatory Visit
Admission: RE | Admit: 2023-10-03 | Discharge: 2023-10-03 | Disposition: A | Discharge status: Home / Self Care | Payer: Managed Care, Other (non HMO) | Source: Ambulatory Visit | Attending: Family | Admitting: Family

## 2023-10-03 DIAGNOSIS — Z1231 Encounter for screening mammogram for malignant neoplasm of breast: Secondary | ICD-10-CM | POA: Diagnosis present

## 2023-11-21 ENCOUNTER — Other Ambulatory Visit: Payer: Self-pay | Admitting: Family

## 2023-12-31 ENCOUNTER — Ambulatory Visit: Payer: Managed Care, Other (non HMO) | Admitting: Family

## 2024-03-19 ENCOUNTER — Other Ambulatory Visit: Payer: Self-pay | Admitting: Family

## 2024-03-26 ENCOUNTER — Encounter: Payer: Managed Care, Other (non HMO) | Admitting: Dermatology

## 2024-06-17 ENCOUNTER — Encounter: Payer: Self-pay | Admitting: Dermatology

## 2024-06-17 ENCOUNTER — Ambulatory Visit (INDEPENDENT_AMBULATORY_CARE_PROVIDER_SITE_OTHER): Admitting: Dermatology

## 2024-06-17 DIAGNOSIS — D2371 Other benign neoplasm of skin of right lower limb, including hip: Secondary | ICD-10-CM

## 2024-06-17 DIAGNOSIS — Z1283 Encounter for screening for malignant neoplasm of skin: Secondary | ICD-10-CM

## 2024-06-17 DIAGNOSIS — L820 Inflamed seborrheic keratosis: Secondary | ICD-10-CM

## 2024-06-17 DIAGNOSIS — D229 Melanocytic nevi, unspecified: Secondary | ICD-10-CM

## 2024-06-17 DIAGNOSIS — L814 Other melanin hyperpigmentation: Secondary | ICD-10-CM

## 2024-06-17 DIAGNOSIS — D492 Neoplasm of unspecified behavior of bone, soft tissue, and skin: Secondary | ICD-10-CM

## 2024-06-17 DIAGNOSIS — D225 Melanocytic nevi of trunk: Secondary | ICD-10-CM | POA: Diagnosis not present

## 2024-06-17 DIAGNOSIS — L821 Other seborrheic keratosis: Secondary | ICD-10-CM

## 2024-06-17 DIAGNOSIS — W908XXA Exposure to other nonionizing radiation, initial encounter: Secondary | ICD-10-CM | POA: Diagnosis not present

## 2024-06-17 DIAGNOSIS — L578 Other skin changes due to chronic exposure to nonionizing radiation: Secondary | ICD-10-CM | POA: Diagnosis not present

## 2024-06-17 DIAGNOSIS — D239 Other benign neoplasm of skin, unspecified: Secondary | ICD-10-CM

## 2024-06-17 DIAGNOSIS — Z7189 Other specified counseling: Secondary | ICD-10-CM

## 2024-06-17 DIAGNOSIS — D1801 Hemangioma of skin and subcutaneous tissue: Secondary | ICD-10-CM

## 2024-06-17 DIAGNOSIS — L719 Rosacea, unspecified: Secondary | ICD-10-CM

## 2024-06-17 NOTE — Patient Instructions (Addendum)
 Counseling for BBL / IPL / Laser and Coordination of Care Discussed the treatment option of Broad Band Light (BBL) /Intense Pulsed Light (IPL)/ Laser for skin discoloration, including brown spots and redness.  Typically we recommend at least 1-3 treatment sessions about 5-8 weeks apart for best results.  Cannot have tanned skin when BBL performed, and regular use of sunscreen/photoprotection is advised after the procedure to help maintain results. The patient's condition may also require maintenance treatments in the future.  The fee for BBL / laser treatments is $350 per treatment session for the whole face.  A fee can be quoted for other parts of the body.  Insurance typically does not pay for BBL/laser treatments and therefore the fee is an out-of-pocket cost. Recommend prophylactic valtrex treatment. Once scheduled for procedure, will send Rx in prior to patient's appointment.      Cryotherapy Aftercare  Wash gently with soap and water everyday.   Apply Vaseline and Band-Aid daily until healed.     Wound Care Instructions  Cleanse wound gently with soap and water once a day then pat dry with clean gauze. Apply a thin coat of Petrolatum (petroleum jelly, Vaseline) over the wound (unless you have an allergy to this). We recommend that you use a new, sterile tube of Vaseline. Do not pick or remove scabs. Do not remove the yellow or white healing tissue from the base of the wound.  Cover the wound with fresh, clean, nonstick gauze and secure with paper tape. You may use Band-Aids in place of gauze and tape if the wound is small enough, but would recommend trimming much of the tape off as there is often too much. Sometimes Band-Aids can irritate the skin.  You should call the office for your biopsy report after 1 week if you have not already been contacted.  If you experience any problems, such as abnormal amounts of bleeding, swelling, significant bruising, significant pain, or evidence of  infection, please call the office immediately.  FOR ADULT SURGERY PATIENTS: If you need something for pain relief you may take 1 extra strength Tylenol (acetaminophen) AND 2 Ibuprofen (200mg  each) together every 4 hours as needed for pain. (do not take these if you are allergic to them or if you have a reason you should not take them.) Typically, you may only need pain medication for 1 to 3 days.        Due to recent changes in healthcare laws, you may see results of your pathology and/or laboratory studies on MyChart before the doctors have had a chance to review them. We understand that in some cases there may be results that are confusing or concerning to you. Please understand that not all results are received at the same time and often the doctors may need to interpret multiple results in order to provide you with the best plan of care or course of treatment. Therefore, we ask that you please give us  2 business days to thoroughly review all your results before contacting the office for clarification. Should we see a critical lab result, you will be contacted sooner.   If You Need Anything After Your Visit  If you have any questions or concerns for your doctor, please call our main line at [phone] and press option 4 to reach your doctor's medical assistant. If no one answers, please leave a voicemail as directed and we will return your call as soon as possible. Messages left after 4 pm will be answered the following business  day.   You may also send us  a message via MyChart. We typically respond to MyChart messages within 1-2 business days.  For prescription refills, please ask your pharmacy to contact our office. Our fax number is [phone].  If you have an urgent issue when the clinic is closed that cannot wait until the next business day, you can page your doctor at the number below.    Please note that while we do our best to be available for urgent issues outside of office  hours, we are not available 24/7.   If you have an urgent issue and are unable to reach us , you may choose to seek medical care at your doctor's office, retail clinic, urgent care center, or emergency room.  If you have a medical emergency, please immediately call 911 or go to the emergency department.  Pager Numbers  - Dr. Hester: [phone]  - Dr. Jackquline: [phone]  - Dr. Claudene: [phone]   In the event of inclement weather, please call our main line at [phone] for an update on the status of any delays or closures.  Dermatology Medication Tips: Please keep the boxes that topical medications come in in order to help keep track of the instructions about where and how to use these. Pharmacies typically print the medication instructions only on the boxes and not directly on the medication tubes.   If your medication is too expensive, please contact our office at [phone] option 4 or send us  a message through MyChart.   We are unable to tell what your co-pay for medications will be in advance as this is different depending on your insurance coverage. However, we may be able to find a substitute medication at lower cost or fill out paperwork to get insurance to cover a needed medication.   If a prior authorization is required to get your medication covered by your insurance company, please allow us  1-2 business days to complete this process.  Drug prices often vary depending on where the prescription is filled and some pharmacies may offer cheaper prices.  The website www.goodrx.com contains coupons for medications through different pharmacies. The prices here do not account for what the cost may be with help from insurance (it may be cheaper with your insurance), but the website can give you the price if you did not use any insurance.  - You can print the associated coupon and take it with your prescription to the pharmacy.  - You may also stop by our office during  regular business hours and pick up a GoodRx coupon card.  - If you need your prescription sent electronically to a different pharmacy, notify our office through [facility] or by phone at [phone] option 4.     Si Usted Necesita Algo Despus de Su Visita  Tambin puede enviarnos un mensaje a travs de Clinical cytogeneticist. Por lo general respondemos a los mensajes de MyChart en el transcurso de 1 a 2 das hbiles.  Para renovar recetas, por favor pida a su farmacia que se ponga en contacto con nuestra oficina. Randi lakes de fax es [place] [phone].  Si tiene un asunto urgente cuando la clnica est cerrada y que no puede esperar hasta el siguiente da hbil, puede llamar/localizar a su doctor(a) al nmero que aparece a continuacin.   Por favor, tenga en cuenta que aunque hacemos todo lo posible para estar disponibles para asuntos urgentes fuera del horario de [place], no estamos disponibles las 24 horas del da, los [address]  de la semana.   Si tiene un problema urgente y no puede comunicarse con nosotros, puede optar por buscar atencin mdica  en el consultorio de su doctor(a), en una clnica privada, en un centro de atencin urgente o en una sala de emergencias.  Si tiene Engineer, drilling, por favor llame inmediatamente al 911 o vaya a la sala de emergencias.  Nmeros de bper  - Dr. Hester: [phone]  - Dra. Jackquline: [phone]  - Dr. Claudene: [phone]   En caso de inclemencias del tiempo, por favor llame a landry capes principal al [phone] para una actualizacin sobre el [place] de cualquier retraso o cierre.  Consejos para la medicacin en dermatologa: Por favor, guarde las cajas en las que vienen los medicamentos de uso tpico para ayudarle a seguir las instrucciones sobre dnde y cmo usarlos. Las farmacias generalmente imprimen las instrucciones del medicamento slo en las cajas y no directamente en los tubos del [place].   Si su medicamento es muy  caro, por favor, pngase en contacto con landry rieger llamando al [phone] y presione la opcin 4 o envenos un mensaje a travs de Clinical cytogeneticist.   No podemos decirle cul ser su copago por los medicamentos por adelantado ya que esto es diferente dependiendo de la cobertura de su seguro. Sin embargo, es posible que podamos encontrar un medicamento sustituto a Audiological scientist un formulario para que el seguro cubra el medicamento que se considera necesario.   Si se requiere una autorizacin previa para que su compaa de seguros [place] su medicamento, por favor permtanos de 1 a 2 das hbiles para completar este proceso.  Los precios de los medicamentos varan con frecuencia dependiendo del Environmental consultant de dnde se surte la receta y alguna farmacias pueden ofrecer precios ms baratos.  El sitio web www.goodrx.com tiene cupones para medicamentos de Health and safety inspector. Los precios aqu no tienen en cuenta lo que podra costar con la ayuda del seguro (puede ser ms barato con su seguro), pero el sitio web puede darle el precio si no utiliz Tourist information centre manager.  - Puede imprimir el cupn correspondiente y llevarlo con su receta a la farmacia.  - Tambin puede pasar por nuestra oficina durante el horario de atencin regular y Education officer, museum una tarjeta de cupones de GoodRx.  - Si necesita que su receta se enve electrnicamente a una farmacia diferente, informe a nuestra oficina a travs de MyChart de [place] o por telfono llamando al [phone] y presione la opcin 4.

## 2024-06-17 NOTE — Progress Notes (Signed)
 Follow-Up Visit   Subjective  [name] is a 63 y.o. female who presents for the following: Skin Cancer Screening and Full Body Skin Exam The patient presents for Total-Body Skin Exam (TBSE) for skin cancer screening and mole check. The patient has spots, moles and lesions to be evaluated, some may be new or changing and the patient may have concern these could be cancer.  The following portions of the chart were reviewed this encounter and updated as appropriate: medications, allergies, medical history  Review of Systems:  No other skin or systemic complaints except as noted in HPI or Assessment and Plan.  Objective  Well appearing patient in no apparent distress; mood and affect are within normal limits.  A full examination was performed including scalp, head, eyes, ears, nose, lips, neck, chest, axillae, abdomen, back, buttocks, bilateral upper extremities, bilateral lower extremities, hands, feet, fingers, toes, fingernails, and toenails. All findings within normal limits unless otherwise noted below.   Relevant physical exam findings are noted in the Assessment and Plan.  RLQA anterior waistline 0.7 x 0.5 cm irregular brown macule   right calf x 1 Stuck-on, waxy, tan-brown papule or plaque --Discussed benign etiology and prognosis.   Assessment & Plan   SKIN CANCER SCREENING PERFORMED TODAY.  ACTINIC DAMAGE - Chronic condition, secondary to cumulative UV/sun exposure - diffuse scaly erythematous macules with underlying dyspigmentation - Recommend daily broad spectrum sunscreen SPF 30+ to sun-exposed areas, reapply every 2 hours as needed.  - Staying in the shade or wearing long sleeves, sun glasses (UVA+UVB protection) and wide brim hats (4-inch brim around the entire circumference of the hat) are also recommended for sun protection.  - Call for new or changing lesions.  LENTIGINES, SEBORRHEIC KERATOSES, HEMANGIOMAS - Benign normal skin lesions - Benign-appearing -  Call for any changes  MELANOCYTIC NEVI - Tan-brown and/or pink-flesh-colored symmetric macules and papules - Benign appearing on exam today - Observation - Call clinic for new or changing moles - Recommend daily use of broad spectrum spf 30+ sunscreen to sun-exposed areas.    ROSACEA Exam Mid face erythema with telangiectasias  Chronic and persistent condition with duration or expected duration over one year. Condition is symptomatic/ bothersome to patient. Not currently at goal.  Rosacea is a chronic progressive skin condition usually affecting the face of adults, causing redness and/or acne bumps. It is treatable but not curable. It sometimes affects the eyes (ocular rosacea) as well. It may respond to topical and/or systemic medication and can flare with stress, sun exposure, alcohol, exercise, topical steroids (including hydrocortisone/cortisone 10) and some foods.  Daily application of broad spectrum spf 30+ sunscreen to face is recommended to reduce flares. Treatment Plan Counseling for BBL / IPL / Laser and Coordination of Care Discussed the treatment option of Broad Band Light (BBL) /Intense Pulsed Light (IPL)/ Laser for skin discoloration, including brown spots and redness.  Typically we recommend at least 1-3 treatment sessions about 5-8 weeks apart for best results.  Cannot have tanned skin when BBL performed, and regular use of sunscreen/photoprotection is advised after the procedure to help maintain results. The patient's condition may also require maintenance treatments in the future.  The fee for BBL / laser treatments is $350 per treatment session for the whole face.  A fee can be quoted for other parts of the body.  Insurance typically does not pay for BBL/laser treatments and therefore the fee is an out-of-pocket cost. Recommend prophylactic valtrex treatment. Once scheduled for procedure,  will send Rx in prior to patient's appointment.     DERMATOFIBROMA Right ankle, right  knee  Exam: Firm pink/brown papulenodule with dimple sign. Treatment Plan: A dermatofibroma is a benign growth possibly related to trauma, such as an insect bite, cut from shaving, or inflamed acne-type bump.  Treatment options to remove include shave or excision with resulting scar and risk of recurrence.  Since benign-appearing and not bothersome, will observe for now.    NEOPLASM OF SKIN RLQA anterior waistline Epidermal / dermal shaving  Lesion diameter (cm):  0.7 Informed consent: discussed and consent obtained   Timeout: patient name, date of birth, surgical site, and procedure verified   Procedure prep:  Patient was prepped and draped in usual sterile fashion Prep type:  Isopropyl alcohol Anesthesia: the lesion was anesthetized in a standard fashion   Anesthetic:  1% lidocaine w/ epinephrine 1-100,000 buffered w/ 8.4% NaHCO3 Hemostasis achieved with: pressure, aluminum chloride and electrodesiccation   Outcome: patient tolerated procedure well   Post-procedure details: sterile dressing applied and wound care instructions given   Dressing type: bandage and petrolatum    Related Procedures Anatomic Pathology Report INFLAMED SEBORRHEIC KERATOSIS right calf x 1 Symptomatic, irritating, patient would like treated.  Destruction of lesion - right calf x 1 Complexity: simple   Destruction method: cryotherapy   Informed consent: discussed and consent obtained   Timeout:  patient name, date of birth, surgical site, and procedure verified Lesion destroyed using liquid nitrogen: Yes   Region frozen until ice ball extended beyond lesion: Yes   Outcome: patient tolerated procedure well with no complications   Post-procedure details: wound care instructions given      Return in about 1 year (around 06/17/2025) for TBSE .  [name], CMA, am acting as scribe for Alm Rhyme, MD .   Documentation: I have reviewed the above documentation for accuracy and completeness, and I  agree with the above.  Alm Rhyme, MD

## 2024-06-23 ENCOUNTER — Ambulatory Visit: Payer: Self-pay | Admitting: Dermatology

## 2024-06-23 LAB — ANATOMIC PATHOLOGY REPORT

## 2024-06-23 LAB — SPECIMEN STATUS REPORT

## 2024-06-23 NOTE — Telephone Encounter (Signed)
Pt informed of pathology results.  

## 2024-06-23 NOTE — Telephone Encounter (Signed)
-----   Message from Alm Rhyme sent at 06/23/2024  5:27 PM EDT ----- Diagnosis synopsis: Comment  Comment: Part 1-Right Lower Quadrant Anterior Waistline ,Skin Biopsy: JUNCTIONAL MELANOCYTIC NEVUS.   Benign mole  Recheck next visit ----- Message ----- From: Interface, [facility] Lab Results In Sent: 06/23/2024  11:35 AM EDT To: Alm JAYSON Rhyme, MD

## 2024-10-14 ENCOUNTER — Other Ambulatory Visit: Payer: Self-pay | Admitting: Family

## 2024-11-06 ENCOUNTER — Other Ambulatory Visit: Payer: Self-pay | Admitting: Family

## 2024-11-06 DIAGNOSIS — Z1231 Encounter for screening mammogram for malignant neoplasm of breast: Secondary | ICD-10-CM

## 2024-11-27 ENCOUNTER — Other Ambulatory Visit: Payer: Self-pay | Admitting: Medical Genetics

## 2024-11-28 ENCOUNTER — Other Ambulatory Visit
Admission: RE | Admit: 2024-11-28 | Discharge: 2024-11-28 | Disposition: A | Discharge status: Home / Self Care | Payer: Self-pay | Source: Ambulatory Visit | Attending: Medical Genetics | Admitting: Medical Genetics

## 2024-12-03 ENCOUNTER — Ambulatory Visit
Admission: RE | Admit: 2024-12-03 | Discharge: 2024-12-03 | Disposition: A | Discharge status: Home / Self Care | Source: Ambulatory Visit | Attending: Family | Admitting: Family

## 2024-12-03 DIAGNOSIS — Z1231 Encounter for screening mammogram for malignant neoplasm of breast: Secondary | ICD-10-CM | POA: Insufficient documentation

## 2024-12-15 LAB — GENECONNECT MOLECULAR SCREEN: Genetic Analysis Overall Interpretation: NEGATIVE

## 2024-12-16 LAB — HELIX PHARMACOGENOMICS (PGX) CLOPIDOGREL TEST

## 2025-01-06 ENCOUNTER — Other Ambulatory Visit: Payer: Self-pay | Admitting: Family

## 2025-06-17 ENCOUNTER — Ambulatory Visit: Admitting: Dermatology
